# Patient Record
Sex: Female | Born: 1960 | Race: White | Hispanic: No | Marital: Married | Smoking: Never smoker
Health system: Southern US, Community
[De-identification: ages and names within clinical notes are randomized; demographics above are authoritative.]

## PROBLEM LIST (undated history)

## (undated) DIAGNOSIS — I10 Essential (primary) hypertension: Secondary | ICD-10-CM

## (undated) DIAGNOSIS — E559 Vitamin D deficiency, unspecified: Secondary | ICD-10-CM

## (undated) DIAGNOSIS — R7301 Impaired fasting glucose: Secondary | ICD-10-CM

## (undated) DIAGNOSIS — E785 Hyperlipidemia, unspecified: Secondary | ICD-10-CM

## (undated) HISTORY — DX: Hyperlipidemia, unspecified: E78.5

## (undated) HISTORY — PX: HYSTEROSCOPY: SHX211

## (undated) HISTORY — PX: WISDOM TOOTH EXTRACTION: SHX21

## (undated) HISTORY — DX: Essential (primary) hypertension: I10

## (undated) HISTORY — DX: Impaired fasting glucose: R73.01

## (undated) HISTORY — DX: Vitamin D deficiency, unspecified: E55.9

---

## 1998-05-03 ENCOUNTER — Other Ambulatory Visit: Admission: RE | Admit: 1998-05-03 | Discharge: 1998-05-03 | Payer: Self-pay | Admitting: Gynecology

## 1999-09-27 ENCOUNTER — Other Ambulatory Visit: Admission: RE | Admit: 1999-09-27 | Discharge: 1999-09-27 | Payer: Self-pay | Admitting: Obstetrics and Gynecology

## 2001-01-28 ENCOUNTER — Other Ambulatory Visit: Admission: RE | Admit: 2001-01-28 | Discharge: 2001-01-28 | Payer: Self-pay | Admitting: Obstetrics and Gynecology

## 2001-04-27 ENCOUNTER — Ambulatory Visit (HOSPITAL_COMMUNITY): Admission: RE | Admit: 2001-04-27 | Discharge: 2001-04-27 | Payer: Self-pay | Admitting: Obstetrics and Gynecology

## 2001-08-24 ENCOUNTER — Encounter: Admission: RE | Admit: 2001-08-24 | Discharge: 2001-11-22 | Payer: Self-pay | Admitting: Family Medicine

## 2004-02-20 ENCOUNTER — Encounter: Admission: RE | Admit: 2004-02-20 | Discharge: 2004-05-20 | Payer: Self-pay | Admitting: Family Medicine

## 2004-03-22 ENCOUNTER — Ambulatory Visit (HOSPITAL_BASED_OUTPATIENT_CLINIC_OR_DEPARTMENT_OTHER): Admission: RE | Admit: 2004-03-22 | Discharge: 2004-03-22 | Payer: Self-pay | Admitting: General Surgery

## 2004-03-22 ENCOUNTER — Ambulatory Visit (HOSPITAL_COMMUNITY): Admission: RE | Admit: 2004-03-22 | Discharge: 2004-03-22 | Payer: Self-pay | Admitting: General Surgery

## 2005-02-10 DIAGNOSIS — R7301 Impaired fasting glucose: Secondary | ICD-10-CM

## 2005-02-10 HISTORY — DX: Impaired fasting glucose: R73.01

## 2013-02-25 ENCOUNTER — Other Ambulatory Visit: Payer: Self-pay | Admitting: Family Medicine

## 2013-03-24 ENCOUNTER — Encounter: Payer: Self-pay | Admitting: Nurse Practitioner

## 2013-04-18 ENCOUNTER — Ambulatory Visit (INDEPENDENT_AMBULATORY_CARE_PROVIDER_SITE_OTHER): Payer: Managed Care, Other (non HMO) | Admitting: Nurse Practitioner

## 2013-04-18 ENCOUNTER — Encounter: Payer: Self-pay | Admitting: Nurse Practitioner

## 2013-04-18 VITALS — BP 136/82 | HR 72 | Ht 61.75 in | Wt 122.0 lb

## 2013-04-18 DIAGNOSIS — N952 Postmenopausal atrophic vaginitis: Secondary | ICD-10-CM

## 2013-04-18 DIAGNOSIS — R7302 Impaired glucose tolerance (oral): Secondary | ICD-10-CM

## 2013-04-18 DIAGNOSIS — IMO0002 Reserved for concepts with insufficient information to code with codable children: Secondary | ICD-10-CM | POA: Insufficient documentation

## 2013-04-18 DIAGNOSIS — Z124 Encounter for screening for malignant neoplasm of cervix: Secondary | ICD-10-CM

## 2013-04-18 DIAGNOSIS — Z0000 Encounter for general adult medical examination without abnormal findings: Secondary | ICD-10-CM

## 2013-04-18 DIAGNOSIS — R7309 Other abnormal glucose: Secondary | ICD-10-CM

## 2013-04-18 DIAGNOSIS — R82998 Other abnormal findings in urine: Secondary | ICD-10-CM

## 2013-04-18 DIAGNOSIS — R8290 Unspecified abnormal findings in urine: Secondary | ICD-10-CM

## 2013-04-18 DIAGNOSIS — Z01419 Encounter for gynecological examination (general) (routine) without abnormal findings: Secondary | ICD-10-CM

## 2013-04-18 MED ORDER — ESTRADIOL 0.1 MG/GM VA CREA: TOPICAL_CREAM | VAGINAL | Status: DC

## 2013-04-18 NOTE — Patient Instructions (Addendum)

## 2013-04-18 NOTE — Progress Notes (Signed)
Patient ID: Cynthia Cardenas, female   DOB: 1960-03-27, [age] y.o.   MRN: [id number] 53 y.o. 282P2002 Married Caucasian Fe here for NGYN annual exam changing from [facility].  She is postmenopausal since 2007 without HRT.  She did have some vaso symptoms that were tolerable.  Now increase in vaginal dryness and dyspareunia.  She has tried lubrications but just not feel that they help.  She and husband are frustrated. She is having no urinary symptoms.  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  They do have some stress with a daughter age 55 with ADD and has been mistreated by her boyfriend. She has neglected herself in trying to help establish the family business and work with her children.  Patient's last menstrual period was 02/10/2005.          Sexually active: yes  The current method of family planning is post menopausal status.    Exercising: no  The patient does not participate in regular exercise at present. Smoker:  no  Health Maintenance: Pap:  12/08/07, WNL, no history of abnormal MMG:  03/2013 Colonoscopy:  Scheduled for 05/2013 BMD:   12/20/07 @ [facility] TDaP:  2006 Labs: [facility]  Urine:  2+ leuk's, trace protein, pH 5.0   reports that she has never smoked. She has never used smokeless tobacco. She reports that she drinks alcohol. She reports that she does not use illicit drugs.  Past Medical History  Diagnosis Date  . Hypertension   . Hyperlipidemia   . Vitamin D deficiency   . Impaired fasting glucose 2007    Past Surgical History  Procedure Laterality Date  . Hysteroscopy      D&C  . Wisdom tooth extraction  early 20's    Current Outpatient Prescriptions  Medication Sig Dispense Refill  . atorvastatin (LIPITOR) 80 MG tablet Take 80 mg by mouth daily.      . Cholecalciferol (VITAMIN D) 2000 UNITS CAPS Take 1 capsule by mouth daily.      Marland Kitchen. losartan (COZAAR) 25 MG tablet Take 25 mg by mouth daily.      . metFORMIN (GLUCOPHAGE-XR) 500 MG 24 hr  tablet Take 500 mg by mouth daily with breakfast.      . estradiol (ESTRACE) 0.1 MG/GM vaginal cream Use 1 gm initially Three times a week  42.5 g  3   No current facility-administered medications for this visit.    Family History  Problem Relation Age of Onset  . Hypertension Mother   . Hyperlipidemia Mother   . Hypertension Father   . Heart disease Father     stent  . Hyperlipidemia Father   . Lung cancer Maternal Grandmother     smoker  . Heart attack Maternal Grandfather   . Hypertension Paternal Grandmother   . Heart disease Paternal Grandmother   . Heart attack Paternal Grandfather   . ADD / ADHD Daughter     ROS:  Pertinent items are noted in HPI.  Otherwise, a comprehensive ROS was negative.  Exam:   BP 136/82  Pulse 72  Ht 5' 1.75" (1.568 m)  Wt 122 lb (55.339 kg)  BMI 22.51 kg/m2  LMP 02/10/2005 Height: 5' 1.75" (156.8 cm)  Ht Readings from Last 3 Encounters:  04/18/13 5' 1.75" (1.568 m)    General appearance: alert, cooperative and appears stated age Head: Normocephalic, without obvious abnormality, atraumatic Neck: no adenopathy, supple, symmetrical, trachea midline and thyroid normal to inspection and palpation Lungs: clear to  auscultation bilaterally Breasts: normal appearance, no masses or tenderness Heart: regular rate and rhythm Abdomen: soft, non-tender; no masses,  no organomegaly Extremities: extremities normal, atraumatic, no cyanosis or edema Skin: Skin color, texture, turgor normal. No rashes or lesions Lymph nodes: Cervical, supraclavicular, and axillary nodes normal. No abnormal inguinal nodes palpated Neurologic: Grossly normal   Pelvic: External genitalia:  no lesions              Urethra:  normal appearing urethra with no masses, tenderness or lesions              Bartholin's and Skene's: normal                 Vagina: very atrophic vagina with pale color and thin yellow discharge, no lesions              Cervix: retroverted and  painful exam due to atrophy and position of uterus              Pap taken: yes Bimanual Exam:  Uterus:  normal size, contour, position, consistency, mobility, non-tender              Adnexa: no mass, fullness, tenderness               Rectovaginal: Confirms               Anus:  normal sphincter tone, no lesions  A:  Well Woman with normal exam  Postmenopausal very short course of HRT  Atrophic Vaginitis with dyspareunia  Impaired glucose tolerance  R/O UTI  History of HTN, hyperlipidemia  P:   Pap smear as per guidelines Done today  Estrace vaginal Cream 1 Gm three times weekly until recheck in 3 months  Counseled with risk of CVA, DVT, cancer, etc  Will follow with urine culture - no treatment started since asymptomatic  Mammogram due 03/2014  ROI for BMD and Mammo  Counseled on breast self exam, mammography screening, adequate intake of calcium and vitamin D, diet and exercise, Kegel's exercises return annually or prn  An After Visit Summary was printed and given to the patient.    She was a former patient of mine at [facility]

## 2013-04-19 ENCOUNTER — Encounter: Payer: Self-pay | Admitting: Nurse Practitioner

## 2013-04-19 LAB — URINE CULTURE
COLONY COUNT: NO GROWTH
ORGANISM ID, BACTERIA: NO GROWTH

## 2013-04-19 NOTE — Progress Notes (Signed)
Encounter reviewed by Dr. Temara Lanum Silva.  

## 2013-04-20 LAB — IPS PAP TEST WITH HPV

## 2013-04-25 ENCOUNTER — Telehealth: Payer: Self-pay | Admitting: Nurse Practitioner

## 2013-04-25 ENCOUNTER — Other Ambulatory Visit: Payer: Self-pay | Admitting: Nurse Practitioner

## 2013-04-25 LAB — POCT URINALYSIS DIPSTICK
Bilirubin, UA: NEGATIVE
Blood, UA: NEGATIVE
GLUCOSE UA: NEGATIVE
Ketones, UA: NEGATIVE
Nitrite, UA: NEGATIVE
UROBILINOGEN UA: NEGATIVE
pH, UA: 5

## 2013-04-25 NOTE — Telephone Encounter (Signed)
Results of BMD from [facility] results as follows: T Score: spine -1.0; left femoral neck -0.9; total : -0.2; right femoral neck -1.3; total -0.5.  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  This can now be done at her choice - there may be a slight difference since 2 different machines but does not have to get [facility] since she has transferred care. We did not get Mammo yet. Confirm where she had this and see if we can call.

## 2013-04-27 ENCOUNTER — Telehealth: Payer: Self-pay | Admitting: Nurse Practitioner

## 2013-04-27 ENCOUNTER — Telehealth: Payer: Self-pay | Admitting: *Deleted

## 2013-04-27 NOTE — Telephone Encounter (Signed)
Patient notified see result note 

## 2013-04-27 NOTE — Telephone Encounter (Signed)
S/w Terri at [facility] requested mammogram, the person that usually faxes them is gone for the day she will fax it tomorrow morning (Thursday) asked her to put attention BroughtonStephanie.

## 2013-04-27 NOTE — Telephone Encounter (Signed)
Patient notified, patient would like to have [facility] need order in please. [facility] will call and get this result.

## 2013-04-27 NOTE — Telephone Encounter (Signed)
I have attempted to contact this patient by phone with the following results: left message to return my call on answering machine (mobile).  Pt also has closed phone note regarding BMD.

## 2013-04-27 NOTE — Telephone Encounter (Signed)
Message copied by Luisa DagoPHILLIPS, STEPHANIE C on Wed Apr 27, 2013  9:41 AM ------      Message from: GRUBB, PATRICIA R      Created: Wed Apr 20, 2013  1:10 AM       Let patient know that urine culture is negative ------

## 2013-04-27 NOTE — Telephone Encounter (Signed)
I have attempted to contact this patient by phone with the following results: left message to return my call on answering machine (mobile).  Pt also has result note.

## 2013-04-27 NOTE — Telephone Encounter (Signed)
Pt is returning a call to Stephanie. 

## 2013-04-28 NOTE — Telephone Encounter (Signed)
Jasmine spoke to patient in 04/25/13 phone note.  Closing encounter.

## 2013-04-29 NOTE — Telephone Encounter (Signed)
MMG report received from [facility].  Hardcopy in your folder.  Please [facility] BMD at [facility].

## 2013-05-01 ENCOUNTER — Other Ambulatory Visit: Payer: Self-pay | Admitting: Nurse Practitioner

## 2013-05-01 DIAGNOSIS — E2839 Other primary ovarian failure: Secondary | ICD-10-CM

## 2013-06-20 ENCOUNTER — Telehealth: Payer: Self-pay | Admitting: Nurse Practitioner

## 2013-06-20 NOTE — Telephone Encounter (Signed)
BMD results from 12/20/2007 shows osteopenia of left femoral neck with T score at -1.6. BMD to be scanned.

## 2013-06-21 ENCOUNTER — Telehealth: Payer: Self-pay | Admitting: Orthopedic Surgery

## 2013-06-21 NOTE — Telephone Encounter (Signed)
Pt is returning your call states it is ok to leave a detailed message. There is a signed release stating it is ok to leave a detailed message on file.

## 2013-06-21 NOTE — Telephone Encounter (Signed)
LMTCB for message from PG. 

## 2013-06-21 NOTE — Telephone Encounter (Signed)
Message copied by Alfredo BattyABERNATHY, Elliette Seabolt P on Tue Jun 21, 2013  9:32 AM ------      Message from: Ria CommentGRUBB, PATRICIA R      Created: Mon Jun 20, 2013  7:37 PM       [facility] - this last one from 11/ 2009 shows osteopenia at left femoral neck at -1.6.  Please call patient and have her to repeat BMD this year. ------

## 2013-06-27 NOTE — Telephone Encounter (Signed)
LM on pt's VM as requested to have BMD repeated this year, as last result from 2009 showed osteopenia at left femoral neck. Advised pt she may need to obtain [facility] to take with her if she goes somewhere else to have it done. Pt to call back with any questions.

## 2013-07-19 ENCOUNTER — Encounter: Payer: Self-pay | Admitting: Nurse Practitioner

## 2013-07-19 ENCOUNTER — Ambulatory Visit (INDEPENDENT_AMBULATORY_CARE_PROVIDER_SITE_OTHER): Payer: Managed Care, Other (non HMO) | Admitting: Nurse Practitioner

## 2013-07-19 VITALS — BP 120/64 | HR 68 | Ht 61.75 in | Wt 120.0 lb

## 2013-07-19 DIAGNOSIS — N952 Postmenopausal atrophic vaginitis: Secondary | ICD-10-CM

## 2013-07-19 MED ORDER — ESTRADIOL 2 MG VA RING: 2.0000 mg | VAGINAL_RING | VAGINAL | Status: DC

## 2013-07-19 NOTE — Progress Notes (Signed)
Patient ID: Cynthia Cardenas, female   DOB: 04-May-1960, 53 y.o.   MRN: [id number] S: 53 y.o. G69P2002 Married Caucasian Fe here for follow up on atrophic vaginitis. She has been on Estrace vagina cream for the past three months.  She has been quite pleased and has had good results using the cream.  Less dyspareunia, less urinary symptoms of pressure and incontinence.  Does not need lubrication all the time. She is postmenopausal since 2007 without HRT. She did have some vaso symptoms that were tolerable. Then had increase in vaginal dryness and dyspareunia. She has tried lubrications but just not feel that they help. She and husband were frustrated but are much better now.  She would like to try the Estring as she feels her compliance would be better.  O: Abdomen: soft, non tender, no flank pain  Pelvic: less atrophic signs but not completely better. Still more moisture and not as tender with speculum and bimanual exam.  Urethra is normal.  A: Atrophic vaginitis - improved  P: Will give Rx for Estring and for the next week continue with 1/2 gm of Estrace for extra moisture. Then she can just use the ring.  Recheck in 3 months - if she is not able to remove the ring will do that for her while she is here.   She is very pleased with the outcome of the visit and hormone cream.  She is aware of potential side effects.

## 2013-07-19 NOTE — Patient Instructions (Signed)
Atrophic Vaginitis Atrophic vaginitis is a problem of low levels of estrogen in women. This problem can happen at any age. It is most common in women who have gone through menopause ("the change").  HOW WILL I KNOW IF I HAVE THIS PROBLEM? You may have:  Trouble with peeing (urinating), such as:  Going to the bathroom often.  A hard time holding your pee until you reach a bathroom.  Leaking pee.  Having pain when you pee.  Itching or a burning feeling.  Vaginal bleeding and spotting.  Pain during sex.  Dryness of the vagina.  A yellow, bad-smelling fluid (discharge) coming from the vagina. HOW WILL MY DOCTOR CHECK FOR THIS PROBLEM?  During your exam, your doctor will likely find the problem.  If there is a vaginal fluid, it may be checked for infection. HOW WILL THIS PROBLEM BE TREATED? Keep the vulvar skin as clean as possible. Moisturizers and lubricants can help with some of the symptoms. Estrogen replacement can help. There are 2 ways to take estrogen:  Systemic estrogen gets estrogen to your whole body. It takes many weeks or months before the symptoms get better.  You take an estrogen pill.  You use a skin patch. This is a patch that you put on your skin.  If you still have your uterus, your doctor may ask you to take a hormone. Talk to your doctor about the right medicine for you.  Estrogen cream.  This puts estrogen only at the part of your body where you apply it. The cream is put into the vagina or put on the vulvar skin. For some women, estrogen cream works faster than pills or the patch. CAN ALL WOMEN WITH THIS PROBLEM USE ESTROGEN? No. Women with certain types of cancer, liver problems, or problems with blood clots should not take estrogen. Your doctor can help you decide the best treatment for your symptoms. Document Released: 07/16/2007 Document Revised: 04/21/2011 Document Reviewed: 07/16/2007 Exit[employer] Patient Information 2014 [employer].  

## 2013-07-20 ENCOUNTER — Encounter: Payer: Self-pay | Admitting: Nurse Practitioner

## 2013-07-25 NOTE — Progress Notes (Signed)
Encounter reviewed by Dr. Brook Silva.  

## 2013-09-08 ENCOUNTER — Other Ambulatory Visit: Payer: Self-pay | Admitting: Family Medicine

## 2013-09-08 DIAGNOSIS — R1013 Epigastric pain: Secondary | ICD-10-CM

## 2013-09-13 ENCOUNTER — Ambulatory Visit
Admission: RE | Admit: 2013-09-13 | Discharge: 2013-09-13 | Disposition: A | Discharge status: Home / Self Care | Payer: Managed Care, Other (non HMO) | Source: Ambulatory Visit | Attending: Family Medicine | Admitting: Family Medicine

## 2013-09-13 DIAGNOSIS — R1013 Epigastric pain: Secondary | ICD-10-CM

## 2013-09-13 MED ORDER — IOHEXOL 350 MG/ML SOLN
100.0000 mL | Freq: Once | INTRAVENOUS | Status: AC | PRN
  Administered 2013-09-13: 100 mL via INTRAVENOUS

## 2013-09-29 ENCOUNTER — Other Ambulatory Visit: Payer: Self-pay | Admitting: Gastroenterology

## 2013-09-29 DIAGNOSIS — R1013 Epigastric pain: Secondary | ICD-10-CM

## 2013-09-29 DIAGNOSIS — R1310 Dysphagia, unspecified: Secondary | ICD-10-CM

## 2013-10-06 ENCOUNTER — Other Ambulatory Visit: Payer: Managed Care, Other (non HMO)

## 2013-10-19 ENCOUNTER — Ambulatory Visit (INDEPENDENT_AMBULATORY_CARE_PROVIDER_SITE_OTHER): Payer: Managed Care, Other (non HMO) | Admitting: Nurse Practitioner

## 2013-10-19 ENCOUNTER — Encounter: Payer: Self-pay | Admitting: Nurse Practitioner

## 2013-10-19 VITALS — BP 110/62 | HR 80 | Ht 61.75 in | Wt 121.0 lb

## 2013-10-19 DIAGNOSIS — N952 Postmenopausal atrophic vaginitis: Secondary | ICD-10-CM

## 2013-10-19 NOTE — Patient Instructions (Signed)
None is needed

## 2013-10-19 NOTE — Progress Notes (Signed)
Subjective:     Patient ID: Cynthia Cardenas, female   DOB: 11-26-1960, 53 y.o.   MRN: [id number]  HPI   53 y.o. G73P2002 Married Caucasian Fe here for follow up on atrophic vaginitis. She has been on Estrace vagina cream initially for  three months. She has been quite pleased and has had good results using the cream. Less dyspareunia, less urinary symptoms of pressure and incontinence. Does not need lubrication all the time. She then decided on using the Estring to help with compliance.  She is very happy with this and has noted improvement.  She did try to remove and was unable to do it herself.  Her husband was able to get it out without a problem.  She now has the second one in. She is postmenopausal since 2007 without HRT. She did have some vaso symptoms that were tolerable. Then had increase in vaginal dryness and dyspareunia. These symptoms are all better. She does want to see a plastic surgeon about breast reduction.  She will do some research and go in for consult visits.  Review of Systems  Constitutional: Negative.   Genitourinary: Negative.   Musculoskeletal: Negative.   Skin: Negative.   Neurological: Negative.   Psychiatric/Behavioral: Negative.        Objective:   Physical Exam  Constitutional: She is oriented to person, place, and time. She appears well-developed and well-nourished.  Genitourinary:  Vaginal tissue is much healthier and not atrophic. She has an Estring in place.  Neurological: She is alert and oriented to person, place, and time.  Psychiatric: She has a normal mood and affect. Her behavior is normal. Judgment and thought content normal.       Assessment:     Atrophic vaginitis - much improved with Estring Enlarged breast    Plan:     Continue Estring and will follow at next AEX She will contact plastic surgeon and go in for consult visit for possible breast reduction

## 2013-10-21 ENCOUNTER — Ambulatory Visit
Admission: RE | Admit: 2013-10-21 | Discharge: 2013-10-21 | Disposition: A | Discharge status: Home / Self Care | Payer: Managed Care, Other (non HMO) | Source: Ambulatory Visit | Attending: Gastroenterology | Admitting: Gastroenterology

## 2013-10-21 DIAGNOSIS — R1310 Dysphagia, unspecified: Secondary | ICD-10-CM

## 2013-10-21 DIAGNOSIS — R1013 Epigastric pain: Secondary | ICD-10-CM

## 2013-10-23 NOTE — Progress Notes (Signed)
Encounter reviewed by Dr. Hitesh Fouche Silva.  

## 2013-11-25 ENCOUNTER — Other Ambulatory Visit: Payer: Self-pay

## 2013-12-12 ENCOUNTER — Encounter: Payer: Self-pay | Admitting: Nurse Practitioner

## 2014-04-24 ENCOUNTER — Ambulatory Visit: Payer: Managed Care, Other (non HMO) | Admitting: Nurse Practitioner

## 2014-05-16 ENCOUNTER — Ambulatory Visit (INDEPENDENT_AMBULATORY_CARE_PROVIDER_SITE_OTHER): Payer: Managed Care, Other (non HMO) | Admitting: Nurse Practitioner

## 2014-05-16 ENCOUNTER — Encounter: Payer: Self-pay | Admitting: Nurse Practitioner

## 2014-05-16 VITALS — BP 120/68 | HR 76 | Ht 61.0 in | Wt 124.0 lb

## 2014-05-16 DIAGNOSIS — R8290 Unspecified abnormal findings in urine: Secondary | ICD-10-CM | POA: Diagnosis not present

## 2014-05-16 DIAGNOSIS — Z0000 Encounter for general adult medical examination without abnormal findings: Secondary | ICD-10-CM | POA: Diagnosis not present

## 2014-05-16 DIAGNOSIS — Z01419 Encounter for gynecological examination (general) (routine) without abnormal findings: Secondary | ICD-10-CM

## 2014-05-16 DIAGNOSIS — M8580 Other specified disorders of bone density and structure, unspecified site: Secondary | ICD-10-CM | POA: Diagnosis not present

## 2014-05-16 LAB — POCT URINALYSIS DIPSTICK
BILIRUBIN UA: NEGATIVE
GLUCOSE UA: NEGATIVE
KETONES UA: NEGATIVE
Nitrite, UA: NEGATIVE
Protein, UA: NEGATIVE
RBC UA: NEGATIVE
Urobilinogen, UA: NEGATIVE
pH, UA: 5.5

## 2014-05-16 MED ORDER — ESTRADIOL 2 MG VA RING: 2.0000 mg | VAGINAL_RING | VAGINAL | Status: DC

## 2014-05-16 NOTE — Progress Notes (Signed)
Patient ID: Cynthia Cardenas, female   DOB: 15-Jun-1960, 54 y.o.   MRN: [id number] 54 y.o. G48P2002 Married  Caucasian Fe here for annual exam.  Youngest daughter is getting married 07/2015.  Last HGB AIC was better.  Patient's last menstrual period was 02/10/2005.          Sexually active: Yes.    The current method of family planning is post menopausal status.    Exercising: No.  The patient does not participate in regular exercise at present. Smoker:  no  Health Maintenance: Pap:  04/18/13, negative with neg HR HPV MMG:  03/15/13, Bi-Rads 1:  Negative Colonoscopy:  2015, normal, repeat in 10 years BMD:   01/02/08, T-Score 1.0/-1.3/-1.6 TDaP:  2006 Labs:  HB:  Sigmund Hazel, MD  Urine:  Trace leuk's   reports that she has never smoked. She has never used smokeless tobacco. She reports that she drinks alcohol. She reports that she does not use illicit drugs.  Past Medical History  Diagnosis Date  . Hypertension   . Hyperlipidemia   . Vitamin D deficiency   . Impaired fasting glucose 2007    Past Surgical History  Procedure Laterality Date  . Hysteroscopy      D&C  . Wisdom tooth extraction  early 20's    Current Outpatient Prescriptions  Medication Sig Dispense Refill  . atorvastatin (LIPITOR) 80 MG tablet Take 80 mg by mouth daily.    . Cholecalciferol (VITAMIN D) 2000 UNITS CAPS Take 1 capsule by mouth daily.    Marland Kitchen esomeprazole (NEXIUM) 40 MG capsule Take 1 capsule by mouth daily.    Marland Kitchen estradiol (ESTRING) 2 MG vaginal ring Place 2 mg vaginally every 3 (three) months. Insert a new ring into vagina every 3 months 1 each 3  . losartan-hydrochlorothiazide (HYZAAR) 100-25 MG per tablet Take 1 tablet by mouth daily.    . metFORMIN (GLUCOPHAGE-XR) 500 MG 24 hr tablet Take 500 mg by mouth daily with breakfast.     No current facility-administered medications for this visit.    Family History  Problem Relation Age of Onset  . Hypertension Mother   . Hyperlipidemia Mother   .  Hypertension Father   . Heart disease Father     stent  . Hyperlipidemia Father   . Lung cancer Maternal Grandmother     smoker  . Heart attack Maternal Grandfather   . Hypertension Paternal Grandmother   . Heart disease Paternal Grandmother   . Heart attack Paternal Grandfather   . ADD / ADHD Daughter     ROS:  Pertinent items are noted in HPI.  Otherwise, a comprehensive ROS was negative.  Exam:   BP 120/68 mmHg  Pulse 76  Ht  (1.549 m)  Wt 124 lb (56.246 kg)  BMI 23.44 kg/m2  LMP 02/10/2005 Height:  (154.9 cm) Ht Readings from Last 3 Encounters:  05/16/14  (1.549 m)  10/19/13 5' 1.75" (1.568 m)  07/19/13 5' 1.75" (1.568 m)    General appearance: alert, cooperative and appears stated age Head: Normocephalic, without obvious abnormality, atraumatic Neck: no adenopathy, supple, symmetrical, trachea midline and thyroid normal to inspection and palpation Lungs: clear to auscultation bilaterally Breasts: normal appearance, no masses or tenderness Heart: regular rate and rhythm Abdomen: soft, non-tender; no masses,  no organomegaly Extremities: extremities normal, atraumatic, no cyanosis or edema Skin: Skin color, texture, turgor normal. No rashes or lesions Lymph nodes: Cervical, supraclavicular, and axillary nodes normal. No abnormal inguinal nodes palpated Neurologic:  Grossly normal   Pelvic: External genitalia:  no lesions              Urethra:  normal appearing urethra with no masses, tenderness or lesions              Bartholin's and Skene's: normal                 Vagina: normal appearing vagina with normal color and discharge, no lesions              Cervix: anteverted              Pap taken: No. Bimanual Exam:  Uterus:  normal size, contour, position, consistency, mobility, non-tender              Adnexa: no mass, fullness, tenderness               Rectovaginal: Confirms               Anus:  normal sphincter tone, no lesions  Chaperone present:  no  A:  Well Woman with normal exam  Postmenopausal very short course of HRT Atrophic Vaginitis with dyspareunia Impaired glucose tolerance R/O UTI History of HTN, hyperlipidemia   P:   Reviewed health and wellness pertinent to exam  Pap smear not taken today  Mammogram is scheduled - will get BMD done as well @ [facility]- note faxed  Refilled Estring for a year  Counseled with risk of DVT, CVA, cancer, etc  Follow with urine C&S  Counseled on breast self exam, mammography screening, use and side effects of HRT, adequate intake of calcium and vitamin D, diet and exercise, Kegel's exercises return annually or prn  An After Visit Summary was printed and given to the patient.

## 2014-05-16 NOTE — Patient Instructions (Signed)

## 2014-05-17 LAB — URINE CULTURE
COLONY COUNT: NO GROWTH
ORGANISM ID, BACTERIA: NO GROWTH

## 2014-05-21 NOTE — Progress Notes (Signed)
Encounter reviewed by Dr. Akaya Proffit Silva.  

## 2014-05-30 ENCOUNTER — Telehealth: Payer: Self-pay | Admitting: Nurse Practitioner

## 2014-05-30 NOTE — Telephone Encounter (Signed)
Pleas let pt. Know that BMD done on 05/23/14 shows a T Score at the spine of -1.0; left hip neck at -1.5; right hip neck at -1.9.  This falls into the low normal and upper osteopenic range.  The FRAX score for major fracture in 10 years is 7.4% (goal is <20%); FRAX score for hip fracture in 10 years is 0.8% (goal is < 3%).  She must do walking, aerobic, weight bearing exercise, calcium and Vit D.

## 2014-05-31 NOTE — Telephone Encounter (Signed)
Pt notified of results.  She is agreeable to plan.  She will exercise, continue 2000 IU Vit D as directed and get 1200 mg calcium with diet and supplement daily.

## 2014-06-16 DIAGNOSIS — N62 Hypertrophy of breast: Secondary | ICD-10-CM | POA: Insufficient documentation

## 2014-07-12 HISTORY — PX: BREAST REDUCTION SURGERY: SHX8

## 2014-08-02 ENCOUNTER — Other Ambulatory Visit: Payer: Self-pay

## 2014-08-07 DIAGNOSIS — Z9889 Other specified postprocedural states: Secondary | ICD-10-CM | POA: Insufficient documentation

## 2015-05-21 ENCOUNTER — Ambulatory Visit: Payer: Managed Care, Other (non HMO) | Admitting: Nurse Practitioner

## 2015-05-25 ENCOUNTER — Other Ambulatory Visit: Payer: Self-pay | Admitting: Nurse Practitioner

## 2015-05-28 NOTE — Telephone Encounter (Signed)
Medication refill request: Estring 2 mg Vaginal Ring Last AEX:  05/16/14 Next AEX: 06/08/15 Last MMG (if hormonal medication request): 05/29/14 bi-rads 2: benign Refill authorized: # 1 ring

## 2015-06-08 ENCOUNTER — Ambulatory Visit: Payer: Managed Care, Other (non HMO) | Admitting: Nurse Practitioner

## 2015-10-02 ENCOUNTER — Encounter: Payer: Self-pay | Admitting: Nurse Practitioner

## 2015-10-02 ENCOUNTER — Ambulatory Visit (INDEPENDENT_AMBULATORY_CARE_PROVIDER_SITE_OTHER): Payer: Managed Care, Other (non HMO) | Admitting: Nurse Practitioner

## 2015-10-02 VITALS — BP 114/68 | HR 80 | Ht 61.0 in | Wt 123.0 lb

## 2015-10-02 DIAGNOSIS — Z8639 Personal history of other endocrine, nutritional and metabolic disease: Secondary | ICD-10-CM

## 2015-10-02 DIAGNOSIS — Z87898 Personal history of other specified conditions: Secondary | ICD-10-CM

## 2015-10-02 DIAGNOSIS — N952 Postmenopausal atrophic vaginitis: Secondary | ICD-10-CM

## 2015-10-02 DIAGNOSIS — Z01419 Encounter for gynecological examination (general) (routine) without abnormal findings: Secondary | ICD-10-CM

## 2015-10-02 DIAGNOSIS — M8580 Other specified disorders of bone density and structure, unspecified site: Secondary | ICD-10-CM | POA: Diagnosis not present

## 2015-10-02 DIAGNOSIS — I10 Essential (primary) hypertension: Secondary | ICD-10-CM | POA: Diagnosis not present

## 2015-10-02 MED ORDER — ESTRADIOL 2 MG VA RING: VAGINAL_RING | VAGINAL | 4 refills | Status: DC

## 2015-10-02 NOTE — Patient Instructions (Addendum)

## 2015-10-02 NOTE — Progress Notes (Signed)
Reviewed personally.  M. Suzanne Leodis Alcocer, MD.  

## 2015-10-02 NOTE — Progress Notes (Signed)
Patient ID: Cynthia Cardenas, female   DOB: 1961-01-09, 55 y.o.   MRN: [id number]  55 y.o. 252P2002 Married  Caucasian Fe here for annual exam. She is feeling well.  HGB AIC is good and sees PCP later today.  She has lost weight over the past several yrs which has helped with glucose.  Daughters wedding was in June in [place] and ended up changing venues - but very beautiful.  She had breast reduction with lift last June and now doing well .  Patient's last menstrual period was 02/10/2005.          Sexually active: Yes.    The current method of family planning is post menopausal status.    Exercising: Yes.    walking Smoker:  no  Health Maintenance: Pap 04/18/13, Negative with neg HR HPV MMG: 10/01/15, [facility], no report at time of visit Colonoscopy: 2015, normal, repeat in 10 years, Dr. Matthias HughsBuccini BMD: 05/23/14 T-Score -1.0 Spine / -1.9 Right Femur Neck / -1.5 left Femur Neck TDaP: 2006 Hep C and HIV:  Add to PCP labs Labs: PCP takes care of labs, drawn 10/01/15 at PCP   reports that she has never smoked. She has never used smokeless tobacco. She reports that she drinks alcohol. She reports that she does not use drugs.  Past Medical History:  Diagnosis Date  . Hyperlipidemia   . Hypertension   . Impaired fasting glucose 2007  . Vitamin D deficiency     Past Surgical History:  Procedure Laterality Date  . HYSTEROSCOPY     D&C  . WISDOM TOOTH EXTRACTION  early 20's    Current Outpatient Prescriptions  Medication Sig Dispense Refill  . atorvastatin (LIPITOR) 80 MG tablet Take 80 mg by mouth daily.    . Cholecalciferol (VITAMIN D) 2000 UNITS CAPS Take 1 capsule by mouth daily.    Marland Kitchen. esomeprazole (NEXIUM) 40 MG capsule Take 1 capsule by mouth daily.    Marland Kitchen. ESTRING 2 MG vaginal ring INSERT 1 RING VAGINALLY EVERY 3 MONTHS. INSERT A NEW RING INTO VAGINA EVERY 3 MONTHS 1 each 0  . losartan-hydrochlorothiazide (HYZAAR) 100-25 MG per tablet Take 1 tablet by mouth daily.    . metFORMIN  (GLUCOPHAGE-XR) 500 MG 24 hr tablet Take 500 mg by mouth daily with breakfast.     No current facility-administered medications for this visit.     Family History  Problem Relation Age of Onset  . Hypertension Mother   . Hyperlipidemia Mother   . Hypertension Father   . Heart disease Father     stent  . Hyperlipidemia Father   . Lung cancer Maternal Grandmother     smoker  . Heart attack Maternal Grandfather   . Hypertension Paternal Grandmother   . Heart disease Paternal Grandmother   . Heart attack Paternal Grandfather   . ADD / ADHD Daughter     ROS:  Pertinent items are noted in HPI.  Otherwise, a comprehensive ROS was negative.  Exam:   LMP 02/10/2005    Ht Readings from Last 3 Encounters:  05/16/14 5\' 1"  (1.549 m)  10/19/13 5' 1.75" (1.568 m)  07/19/13 5' 1.75" (1.568 m)    General appearance: alert, cooperative and appears stated age Head: Normocephalic, without obvious abnormality, atraumatic Neck: no adenopathy, supple, symmetrical, trachea midline and thyroid normal to inspection and palpation Lungs: clear to auscultation bilaterally Breasts: normal appearance, no masses or tenderness, positive findings: implants bilaterally Heart: regular rate and rhythm Abdomen: soft, non-tender; no masses,  no organomegaly Extremities: extremities normal, atraumatic, no cyanosis or edema Skin: Skin color, texture, turgor normal. No rashes or lesions Lymph nodes: Cervical, supraclavicular, and axillary nodes normal. No abnormal inguinal nodes palpated Neurologic: Grossly normal   Pelvic: External genitalia:  no lesions              Urethra:  normal appearing urethra with no masses, tenderness or lesions              Bartholin's and Skene's: normal                 Vagina: normal appearing vagina with normal color and discharge, no lesions.  Estring in place.              Cervix: anteverted                Pap taken: No. Bimanual Exam:  Uterus:  normal size, contour,  position, consistency, mobility, non-tender              Adnexa: no mass, fullness, tenderness               Rectovaginal: Confirms               Anus:  normal sphincter tone, no lesions  Chaperone present: yes  A:  Well Woman with normal exam   Postmenopausal very short course of HRT Atrophic Vaginitis with dyspareunia - using Estring Impaired glucose tolerance - doing well on Glucophage History of HTN, hyperlipidemia   P:   Reviewed health and wellness pertinent to exam  Pap smear as above  Mammogram is due 09/2016 - if this one is normal  Refill Estring for a year - coupon is given  Counseled with risk of CVA, DVT, cancer, etc.  She will get Hep C and HIV at PCP  Counseled on breast self exam, mammography screening, use and side effects of HRT, adequate intake of calcium and vitamin D, diet and exercise, Kegel's exercises return annually or prn  An After Visit Summary was printed and given to the patient.

## 2015-10-10 ENCOUNTER — Other Ambulatory Visit: Payer: Self-pay | Admitting: Radiology

## 2015-10-16 ENCOUNTER — Encounter: Payer: Self-pay | Admitting: Nurse Practitioner

## 2015-12-04 ENCOUNTER — Encounter: Payer: Self-pay | Admitting: Nurse Practitioner

## 2016-04-15 ENCOUNTER — Telehealth: Payer: Self-pay | Admitting: *Deleted

## 2016-04-15 NOTE — Telephone Encounter (Signed)
Patient in 04 recall for 6 month F/U left breast DX MMG. Please contact patient to schedule. Thanks

## 2016-04-15 NOTE — Telephone Encounter (Signed)
Left message for patient to call Summer back.  

## 2016-04-15 NOTE — Telephone Encounter (Signed)
Per Denny PeonErin at [facility], patient was scheduled, but cancelled appointment for 2/27 stating [place] was [place] of [place].

## 2016-04-21 NOTE — Telephone Encounter (Signed)
Return call from patient.  She is in [place] with her sister-in-law who is in critical care and on a ventilator.  She is unable to come home at this time.  She has a reminder to call and schedule mammogram when she returns to [place], although she does not know when that will be.

## 2016-04-22 NOTE — Telephone Encounter (Signed)
Dr. Hyacinth MeekerMiller, Please see message below- would you like me to extend the recall? Please advise Thanks

## 2016-04-23 NOTE — Telephone Encounter (Signed)
OK to extend recall for two months.  Thanks.

## 2016-04-23 NOTE — Telephone Encounter (Signed)
Recall extended -eh 

## 2016-05-17 IMAGING — RF DG ESOPHAGUS
12 of 23 series · 12 of 24 positions shown · non-contrast
Comparison: 09/13/2013

CLINICAL DATA: Epigastric abdominal pain.  Dysphagia.

EXAM:
ESOPHOGRAM / BARIUM SWALLOW / BARIUM TABLET STUDY
TECHNIQUE: Combined double contrast and single contrast examination performed
using effervescent crystals, thick barium liquid, and thin barium
liquid. The patient was observed with fluoroscopy swallowing a 13mm
barium sulphate tablet.
FLUOROSCOPY TIME:  1 min, 18 seconds

[Series 2: run · 1 of 10 slices shown (1 of 12)]
[im 1/10]
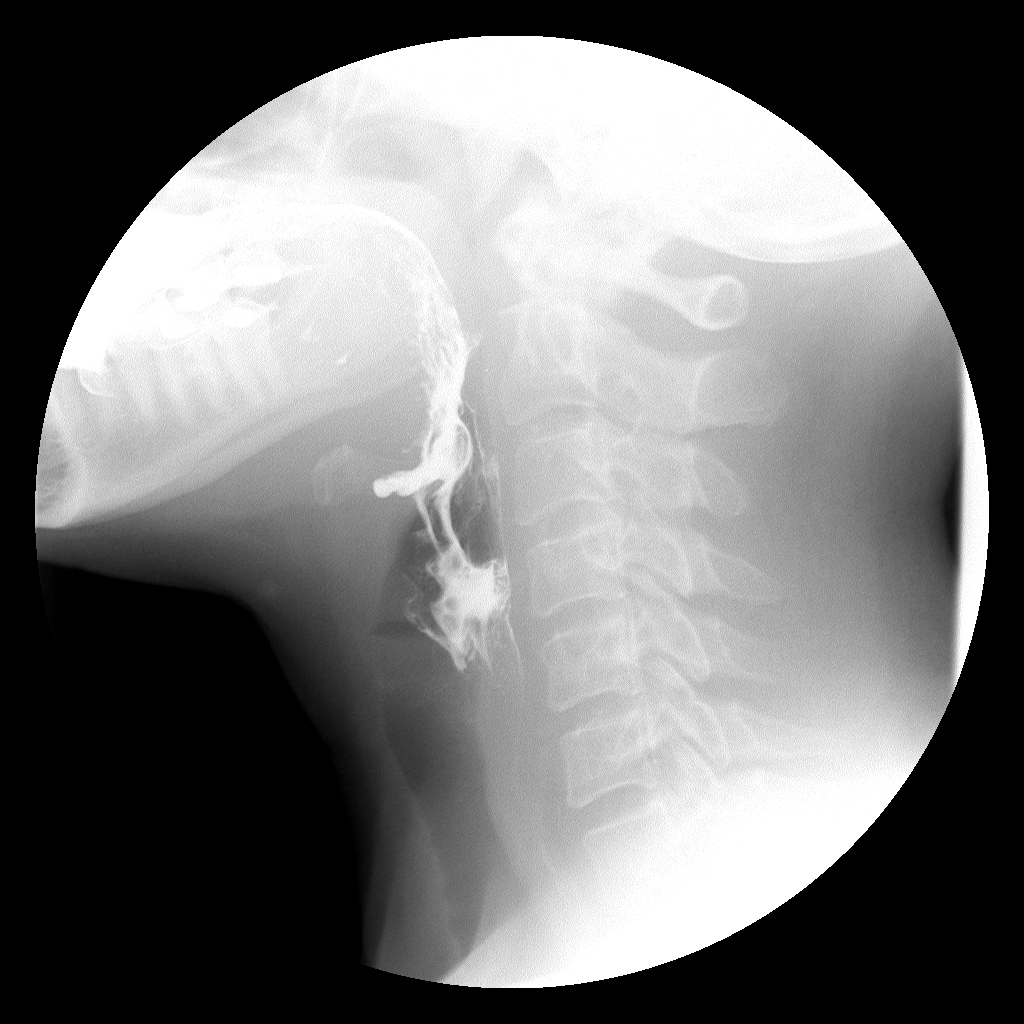

[Series 3: run · 1 of 1 slices shown (2 of 12)]
[im 1/1]
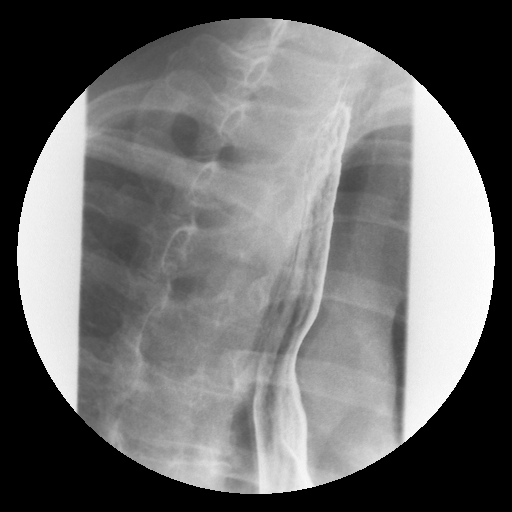

[Series 5: run · 1 of 1 slices shown (3 of 12)]
[im 1/1]
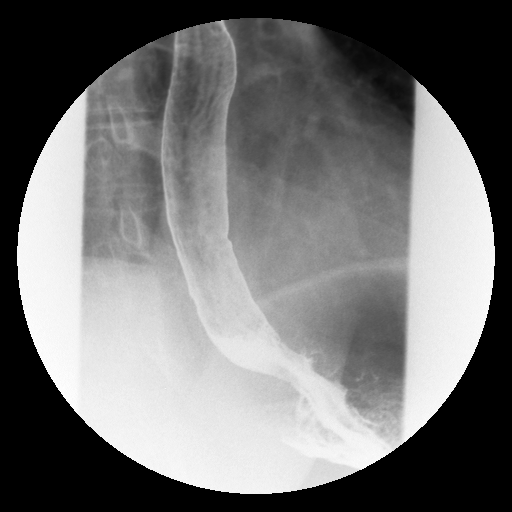

[Series 7: run · 1 of 1 slices shown (4 of 12)]
[im 1/1]
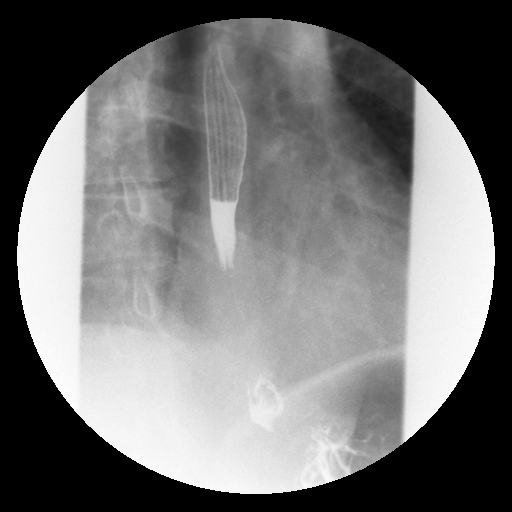

[Series 9: run · 1 of 1 slices shown (5 of 12)]
[im 1/1]
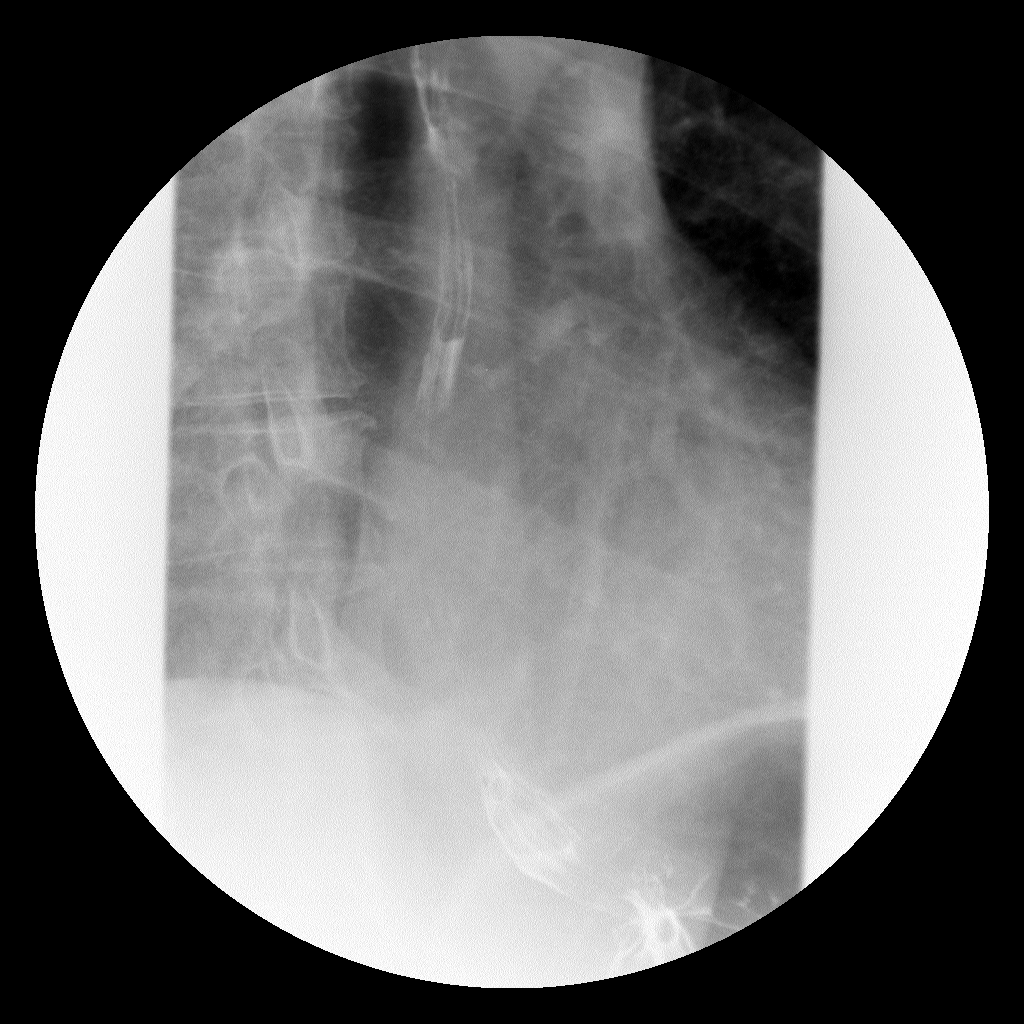

[Series 11: run · 1 of 1 slices shown (6 of 12)]
[im 1/1]
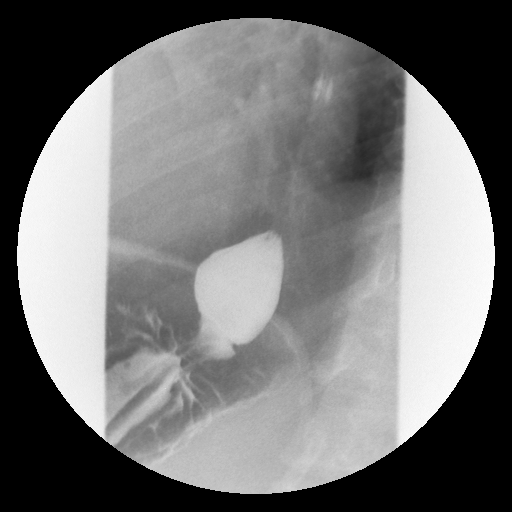

[Series 13: run · 1 of 1 slices shown (7 of 12)]
[im 1/1]
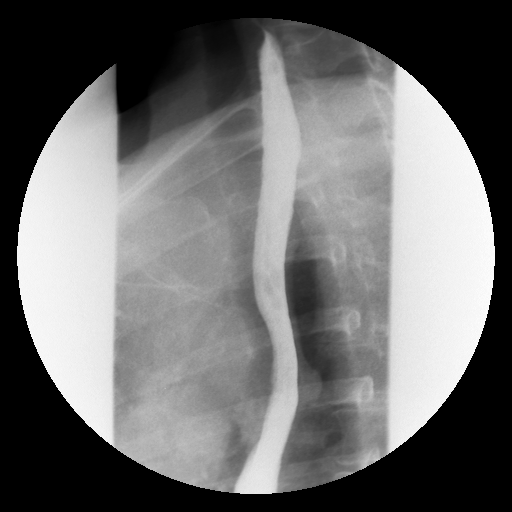

[Series 15: run · 1 of 1 slices shown (8 of 12)]
[im 1/1]
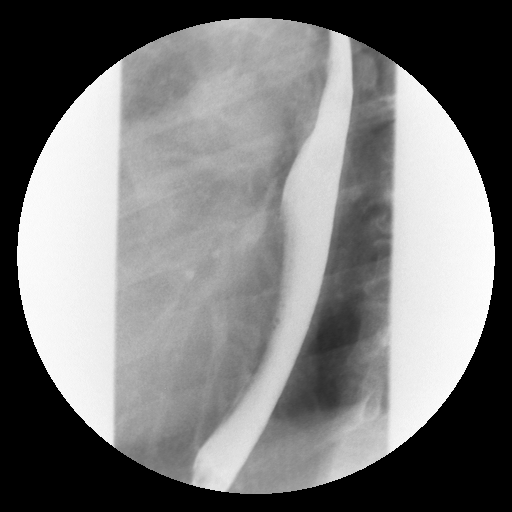

[Series 17: run · 1 of 1 slices shown (9 of 12)]
[im 1/1]
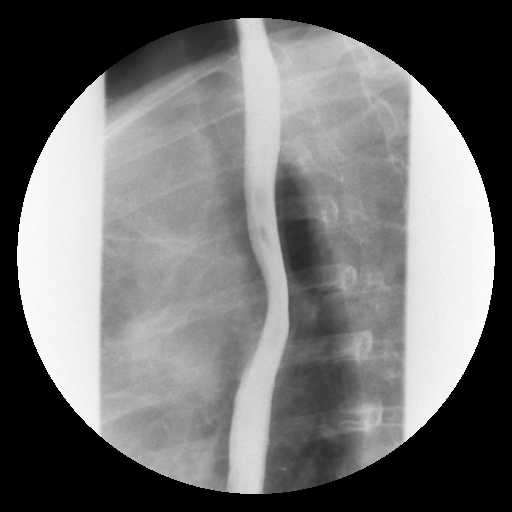

[Series 19: run · 1 of 1 slices shown (10 of 12)]
[im 1/1]
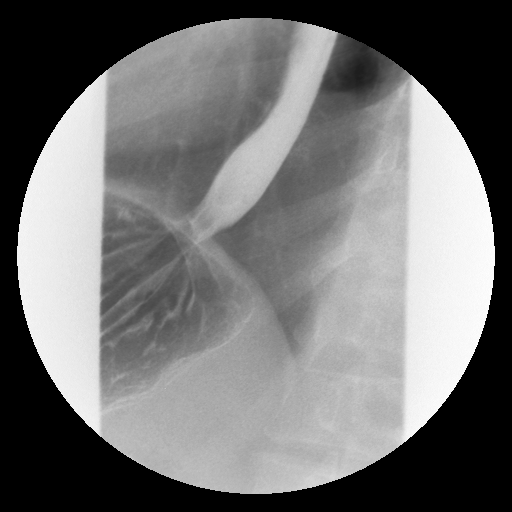

[Series 21: run · 1 of 1 slices shown (11 of 12)]
[im 1/1]
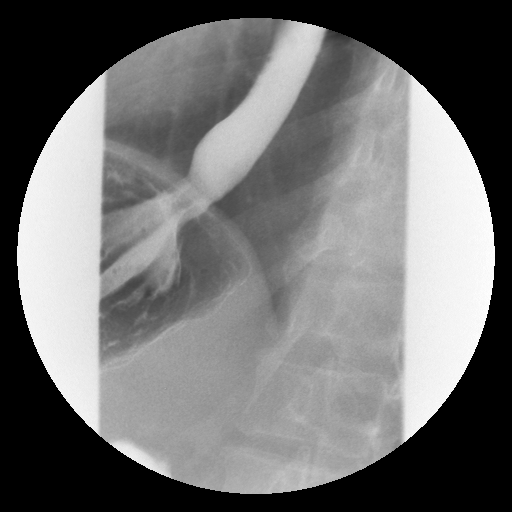

[Series 23: run · 1 of 1 slices shown (12 of 12)]
[im 1/1]
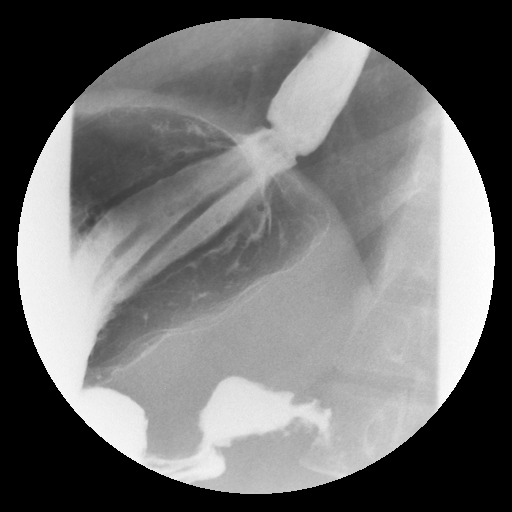

[12 of 24 positions shown; findings below may reference images not displayed]

FINDINGS: The pharyngeal phase of swallowing appears normal.

Normal primary peristaltic waves are present on [DATE] swallows.

There is a 1.3 cm in diameter distal esophageal mucosal ring
(Schatzki ring). This did not obstruct a 13 mm barium tablet from
rapidly entering the stomach.

No other significant abnormality observed.
IMPRESSION: 1. 1.3 cm in diameter. Distal esophageal mucosal ring. This is of a
diameter to commonly cause symptoms with solid foods. Otherwise
normal exam.

## 2016-06-05 ENCOUNTER — Encounter: Payer: Self-pay | Admitting: Nurse Practitioner

## 2016-09-26 ENCOUNTER — Telehealth: Payer: Self-pay | Admitting: Certified Nurse Midwife

## 2016-09-26 ENCOUNTER — Other Ambulatory Visit: Payer: Self-pay | Admitting: Obstetrics & Gynecology

## 2016-09-26 MED ORDER — ESTRADIOL 2 MG VA RING: VAGINAL_RING | VAGINAL | 0 refills | Status: DC

## 2016-09-26 NOTE — Telephone Encounter (Signed)
RF completed.  Encounter closed.

## 2016-09-26 NOTE — Telephone Encounter (Signed)
Medication refill request: Estring   Last AEX:  10-02-15  Next AEX: 10-30-16 Last MMG (if hormonal medication request): 05-27-16- F/U MMG in 6 month  Refill authorized: please advise

## 2016-09-26 NOTE — Telephone Encounter (Signed)
[facility] requesting a script for Estring refill.  Patient's insurance will no longer pay for home pharmacy.

## 2016-10-28 ENCOUNTER — Telehealth: Payer: Self-pay | Admitting: *Deleted

## 2016-10-28 NOTE — Telephone Encounter (Signed)
Call to patient. Patient notified of BMD results and verbalized understanding. See scanned report in EPIC.   Patient agreeable to disposition. Will close encounter.

## 2016-10-30 ENCOUNTER — Ambulatory Visit: Payer: Managed Care, Other (non HMO) | Admitting: Certified Nurse Midwife

## 2016-11-06 ENCOUNTER — Ambulatory Visit (INDEPENDENT_AMBULATORY_CARE_PROVIDER_SITE_OTHER): Payer: PRIVATE HEALTH INSURANCE | Admitting: Certified Nurse Midwife

## 2016-11-06 ENCOUNTER — Other Ambulatory Visit (HOSPITAL_COMMUNITY)
Admission: RE | Admit: 2016-11-06 | Discharge: 2016-11-06 | Disposition: A | Discharge status: Home / Self Care | Payer: PRIVATE HEALTH INSURANCE | Source: Ambulatory Visit | Attending: Obstetrics & Gynecology | Admitting: Obstetrics & Gynecology

## 2016-11-06 ENCOUNTER — Encounter: Payer: Self-pay | Admitting: Certified Nurse Midwife

## 2016-11-06 VITALS — BP 112/62 | HR 72 | Resp 16 | Ht 61.25 in | Wt 124.0 lb

## 2016-11-06 DIAGNOSIS — Z01419 Encounter for gynecological examination (general) (routine) without abnormal findings: Secondary | ICD-10-CM

## 2016-11-06 DIAGNOSIS — N951 Menopausal and female climacteric states: Secondary | ICD-10-CM

## 2016-11-06 DIAGNOSIS — Z124 Encounter for screening for malignant neoplasm of cervix: Secondary | ICD-10-CM | POA: Insufficient documentation

## 2016-11-06 DIAGNOSIS — N898 Other specified noninflammatory disorders of vagina: Secondary | ICD-10-CM | POA: Diagnosis not present

## 2016-11-06 DIAGNOSIS — N952 Postmenopausal atrophic vaginitis: Secondary | ICD-10-CM

## 2016-11-06 NOTE — Patient Instructions (Signed)

## 2016-11-06 NOTE — Progress Notes (Signed)
56 y.o. G85P2002 Married  Caucasian Fe here for annual exam. Menopausal no HRT. Denies vaginal bleeding. Now going to be grandmother! Daughter expecting now in [place].Has noted some increase in vaginal discharge with removal of Estring, no vaginal bleeding, ? Odor. Working well for vaginal dryness. Sees PCP for aex and labs and Cholesterol and hypertension/vitamin D  Management. All stable per patient. No other health issues today. Leaving for [place] tomorrow to be with daughter!   Patient's last menstrual period was 02/10/2005.          Sexually active: Yes.    The current method of family planning is post menopausal status.    Exercising: Yes.    walking Smoker:  no  Health Maintenance: Pap:  04-18-13 neg HPV HR neg History of Abnormal Pap: no MMG: 8/17 biopsy, 05-27-16 left breast birads 3:neg, 9/18 neg per patient Self Breast exams: no Colonoscopy:  2015 f/u 98yrs BMD:   2018 TDaP:  2018 Shingles: not done Pneumonia: not done Hep C and HIV: had done with pcp Labs: if needed.   reports that she has never smoked. She has never used smokeless tobacco. She reports that she drinks about 1.8 - 2.4 oz of alcohol per week . She reports that she does not use drugs.  Past Medical History:  Diagnosis Date  . Hyperlipidemia   . Hypertension   . Impaired fasting glucose 2007  . Vitamin D deficiency     Past Surgical History:  Procedure Laterality Date  . BREAST REDUCTION SURGERY Bilateral 07/2014  . HYSTEROSCOPY     D&C  . WISDOM TOOTH EXTRACTION  early 20's    Current Outpatient Prescriptions  Medication Sig Dispense Refill  . atorvastatin (LIPITOR) 80 MG tablet Take 80 mg by mouth daily.    . Cholecalciferol (VITAMIN D) 2000 UNITS CAPS Take 1 capsule by mouth daily.    Marland Kitchen esomeprazole (NEXIUM) 20 MG capsule Take 1 capsule by mouth daily.    Marland Kitchen estradiol (ESTRING) 2 MG vaginal ring INSERT 1 RING VAGINALLY EVERY 3 MONTHS. INSERT A NEW RING INTO VAGINA EVERY 3 MONTHS 1 each 0  .  losartan-hydrochlorothiazide (HYZAAR) 100-25 MG per tablet Take 1 tablet by mouth daily.     No current facility-administered medications for this visit.     Family History  Problem Relation Age of Onset  . Hypertension Mother   . Hyperlipidemia Mother   . Hypertension Father   . Heart disease Father        stent  . Hyperlipidemia Father   . Lung cancer Maternal Grandmother        smoker  . Heart attack Maternal Grandfather   . Hypertension Paternal Grandmother   . Heart disease Paternal Grandmother   . Heart attack Paternal Grandfather   . ADD / ADHD Daughter     ROS:  Pertinent items are noted in HPI.  Otherwise, a comprehensive ROS was negative.  Exam:   BP 112/62   Pulse 72   Resp 16   Ht 5' 1.25" (1.556 m)   Wt 124 lb (56.2 kg)   LMP 02/10/2005   BMI 23.24 kg/m  Height: 5' 1.25" (155.6 cm) Ht Readings from Last 3 Encounters:  11/06/16 5' 1.25" (1.556 m)  10/02/15  (1.549 m)  05/16/14  (1.549 m)    General appearance: alert, cooperative and appears stated age Head: Normocephalic, without obvious abnormality, atraumatic Neck: no adenopathy, supple, symmetrical, trachea midline and thyroid normal to inspection and palpation Lungs: clear  to auscultation bilaterally Breasts: normal appearance, no masses or tenderness, No nipple retraction or dimpling, No nipple discharge or bleeding, No axillary or supraclavicular adenopathy Heart: regular rate and rhythm Abdomen: soft, non-tender; no masses,  no organomegaly Extremities: extremities normal, atraumatic, no cyanosis or edema Skin: Skin color, texture, turgor normal. No rashes or lesions Lymph nodes: Cervical, supraclavicular, and axillary nodes normal. No abnormal inguinal nodes palpated Neurologic: Grossly normal   Pelvic: External genitalia:  no lesions              Urethra:  normal appearing urethra with no masses, tenderness or lesions              Bartholin's and Skene's: normal                  Vagina: normal appearing vagina with normal color and and grey/beige odorous  discharge, no lesions, Affirm taken              Cervix: multiparous appearance, retroverted and excoriation noted on posterior aspec of cervix, Estring removed which was around cervix firmly, bleeding noted with removal of Estring, which stopped after observation,               Pap taken: Yes.   Bimanual Exam:  Uterus:  normal size, contour, position, consistency, mobility, non-tender              Adnexa: normal adnexa and no mass, fullness, tenderness               Rectovaginal: Confirms               Anus:  normal sphincter tone, no lesions  Chaperone present: yes  A:  Well Woman with normal exam  Menopausal no HRT  Estring use for atrophic vaginitis  Cervical excoriation noted from Estring position  R/O vaginal infection  Hypertension,Cholesterol/Vitamin D management with PCP  P:   Reviewed health and wellness pertinent to exam  Aware of need to advise if vaginal bleeding  Discussed leaving Estring out for one month to allow area to heal, discussed insertion in upper part of vagina. If bleeding occurs need to discontinue use. Not refilled for follow up report.  Continue follow up with PCP as indicated  Pap smear: yes   counseled on breast self exam, mammography screening, feminine hygiene, menopause, adequate intake of calcium and vitamin D, diet and exercise  return annually or prn  An After Visit Summary was printed and given to the patient.

## 2016-11-07 LAB — VAGINITIS/VAGINOSIS, DNA PROBE
Candida Species: NEGATIVE
Gardnerella vaginalis: NEGATIVE
TRICHOMONAS VAG: NEGATIVE

## 2016-11-10 ENCOUNTER — Encounter: Payer: Self-pay | Admitting: Obstetrics & Gynecology

## 2016-11-10 LAB — CYTOLOGY - PAP: Diagnosis: NEGATIVE

## 2016-12-26 ENCOUNTER — Other Ambulatory Visit: Payer: Self-pay | Admitting: Obstetrics & Gynecology

## 2016-12-26 NOTE — Telephone Encounter (Signed)
Medication refill request: estring Last AEX:  11/06/16 DL  Next AEX: 16/02/908/1/19  Last MMG (if hormonal medication request): 10/21/16 BIRADS 2 benign  Refill authorized: 09/26/16 #1, 0RF. Today, please advise.

## 2017-08-10 ENCOUNTER — Other Ambulatory Visit: Payer: Self-pay | Admitting: Certified Nurse Midwife

## 2017-08-10 MED ORDER — ESTRADIOL 2 MG VA RING: VAGINAL_RING | VAGINAL | 1 refills | Status: DC

## 2017-08-10 NOTE — Telephone Encounter (Signed)
Medication refill request: Estring Vaginal ring Last AEX:  11/06/16 Next AEX: 11/10/17 Last MMG (if hormonal medication request): 11/20/16 Neg Refill authorized: Last refilled 12/26/2016 1 with 1 refill. Please approve until AEX if Appropriate

## 2017-08-10 NOTE — Telephone Encounter (Signed)
Patient is requesting refills of her estradiol to  [facility] [phone]. Patient states she is completely out of her medication and has been for some time.

## 2017-10-20 ENCOUNTER — Ambulatory Visit: Payer: PRIVATE HEALTH INSURANCE | Admitting: Certified Nurse Midwife

## 2017-10-20 ENCOUNTER — Other Ambulatory Visit: Payer: Self-pay

## 2017-10-20 ENCOUNTER — Encounter: Payer: Self-pay | Admitting: Certified Nurse Midwife

## 2017-10-20 VITALS — BP 132/78 | HR 70 | Resp 16 | Ht 61.25 in | Wt 123.0 lb

## 2017-10-20 DIAGNOSIS — N950 Postmenopausal bleeding: Secondary | ICD-10-CM | POA: Diagnosis not present

## 2017-10-20 DIAGNOSIS — N898 Other specified noninflammatory disorders of vagina: Secondary | ICD-10-CM | POA: Diagnosis not present

## 2017-10-20 NOTE — Progress Notes (Addendum)
57 y.o. Married Caucasian female G2P2002 here with complaint of vaginal bleeding again after using Estring. Patient removed and did not re-insert. Had seen increase discharge each time prior to bleeding. Describes discharge this time as bloody after sexual activity and had just put Estring back in.  Denies any pain or cramping with occurrence. Onset of symptoms one week ago. Denies new personal products. Does have vaginal dryness and has used coconut oil at times. No other issues today with sexual activity.. Patient is living here off and on and will be back in the area in two weeks. No other health issues today.    Review of Systems  Constitutional: Negative.   HENT: Negative.   Eyes: Negative.   Respiratory: Negative.   Cardiovascular: Negative.   Genitourinary:       Abnormal discharge, unscheduled bleeding & spotting  Musculoskeletal: Negative.   Skin: Negative.   Neurological: Negative.   Endo/Heme/Allergies: Negative.   Psychiatric/Behavioral: Negative.     O:Healthy female WDWN Affect: normal, orientation x 3  Exam: Skin: warm and dry Abdomen:soft, non tender, no masses, suprapubic nontender Inguinal Lymph nodes: no enlargement or tenderness Pelvic exam: External genital: normal female, no atrophy or lesions BUS: negative Vagina: scant white slight odorous discharge noted. Slight atrophy noted also. Small area of healed excoriation noted in posterior fornix     Affirm taken Cervix: normal, non tender, no CMT Uterus: normal, non tender Adnexa:normal, non tender, no masses or fullness noted Rectal area: normal appearance   A:Normal pelvic exam Vaginal bleeding from ? Excoriation from Estring or vigorous stimulation R/O post menopausal bleeding R/O vaginal infection   P:Discussed findings of slight atrophy and healed excoriated area in vagina and etiology. Discussed due previous bleeding with Estring in place,with her last occurrence with evaluation with me, felt it was  cervical. Excoriation noted probable stimulation, but concerns with PMB also.Stressed avoid use of vibrator at this point, until we know no other issues. Patient agreeable. Discussed would recommend PUS to make sure no endometrial tissue concerns with bleeding. Questions addressed. Patient agreeable to PUS. She will be called with insurance information and scheduled. Do not replace Estring at this time. Continue with coconut oil use for dryness. Lab: Affirm will treat if infection present.  Rv prn

## 2017-10-21 ENCOUNTER — Telehealth: Payer: Self-pay | Admitting: Certified Nurse Midwife

## 2017-10-21 LAB — VAGINITIS/VAGINOSIS, DNA PROBE
CANDIDA SPECIES: NEGATIVE
GARDNERELLA VAGINALIS: POSITIVE — AB
TRICHOMONAS VAG: NEGATIVE

## 2017-10-21 NOTE — Telephone Encounter (Signed)
Patient returned call. Spoke with patient regarding benefit for recommended ultrasound. Patient understood and agreeable. Patient ready to schedule. Patient scheduled 11/05/17 with Dr Hyacinth Meeker. Patient aware of appointment date, arrival time and   cancellation policy.  No further questions. Will close encounter   cc: Billie Ruddy, RN

## 2017-10-21 NOTE — Telephone Encounter (Signed)
Call placed to patient to review benefits for a recommended ultrasound. Left voicemail requesting a return call. °

## 2017-10-22 ENCOUNTER — Other Ambulatory Visit: Payer: Self-pay

## 2017-10-22 MED ORDER — METRONIDAZOLE 0.75 % VA GEL: VAGINAL | 0 refills | Status: DC

## 2017-10-23 ENCOUNTER — Telehealth: Payer: Self-pay | Admitting: Certified Nurse Midwife

## 2017-10-23 NOTE — Telephone Encounter (Signed)
Spoke with patient. Patient asking if she can use coconut oil vaginally with metrogel? Advised patient can apply coconut oil externally while using metrogel, can use vaginally after metrogel is completed. Patient verbalizes understanding.  Routing to provider for final review. Patient is agreeable to disposition. Will close encounter.

## 2017-10-23 NOTE — Telephone Encounter (Signed)
Patient would like to know if she can still use coconut oil along with the antibiotic cream.

## 2017-11-05 ENCOUNTER — Ambulatory Visit (INDEPENDENT_AMBULATORY_CARE_PROVIDER_SITE_OTHER): Payer: PRIVATE HEALTH INSURANCE

## 2017-11-05 ENCOUNTER — Ambulatory Visit: Payer: PRIVATE HEALTH INSURANCE | Admitting: Obstetrics & Gynecology

## 2017-11-05 ENCOUNTER — Other Ambulatory Visit: Payer: Self-pay | Admitting: Obstetrics & Gynecology

## 2017-11-05 ENCOUNTER — Encounter: Payer: Self-pay | Admitting: Obstetrics & Gynecology

## 2017-11-05 ENCOUNTER — Other Ambulatory Visit: Payer: PRIVATE HEALTH INSURANCE

## 2017-11-05 VITALS — BP 122/62 | HR 96 | Resp 16 | Ht 61.25 in | Wt 124.4 lb

## 2017-11-05 DIAGNOSIS — N950 Postmenopausal bleeding: Secondary | ICD-10-CM

## 2017-11-05 DIAGNOSIS — N898 Other specified noninflammatory disorders of vagina: Secondary | ICD-10-CM | POA: Diagnosis not present

## 2017-11-05 MED ORDER — ESTRADIOL 10 MCG VA TABS: ORAL_TABLET | VAGINAL | 0 refills | Status: DC

## 2017-11-05 NOTE — Progress Notes (Signed)
GYNECOLOGY  VISIT  CC:   Ultrasound, vaginal discharge after estring use  HPI: 57 y.o. G90P2002 Married White or Caucasian female here for ultrasound to evaluate endometrium after pt experienced two episodes of issues with vaginal discharge and some spotting after using Estring.  Felt this worked really well for a year or so but has not had as much success with this recently.    When seen on 10/20/17, she also have a posterior vaginal excoriation as well.  Would like to evaluate this today as well to make sure biopsy is not needed.  Bleeding and discharge has resolved.    She does want to be on some type of estrogen product vaginally due to atrophic symptoms.  Has tried replens, vaginal estrogens and the estring.  Pt now living in [place], [place], most of the time.  Stays with daughter in [place] when back in the area.  GYNECOLOGIC HISTORY: Patient's last menstrual period was 02/10/2005. Contraception: PMP Menopausal hormone therapy: none  Patient Active Problem List   Diagnosis Date Noted  . Status post bilateral breast reduction 08/07/2014  . Symptomatic mammary hypertrophy 06/16/2014  . Impaired glucose tolerance 04/18/2013  . Dyspareunia 04/18/2013  . Postmenopausal atrophic vaginitis 04/18/2013    Past Medical History:  Diagnosis Date  . Hyperlipidemia   . Hypertension   . Impaired fasting glucose 2007  . Vitamin D deficiency     Past Surgical History:  Procedure Laterality Date  . BREAST REDUCTION SURGERY Bilateral 07/2014  . HYSTEROSCOPY     D&C  . WISDOM TOOTH EXTRACTION  early 20's    MEDS:   Current Outpatient Medications on File Prior to Visit  Medication Sig Dispense Refill  . ALPRAZolam (XANAX) 0.25 MG tablet TK 1/2 TO 1 T PO TID AS NEEDED FOR ANXIETY  0  . atorvastatin (LIPITOR) 80 MG tablet Take 80 mg by mouth daily.    . Cholecalciferol (VITAMIN D) 2000 UNITS CAPS Take 1 capsule by mouth daily.    Marland Kitchen esomeprazole (NEXIUM) 20 MG capsule Take 1 capsule  by mouth daily.    Marland Kitchen losartan-hydrochlorothiazide (HYZAAR) 100-25 MG per tablet Take 1 tablet by mouth daily.    . metroNIDAZOLE (METROGEL) 0.75 % vaginal gel Insert 1 applicatorful vaginally at night times 7 70 g 0   No current facility-administered medications on file prior to visit.     ALLERGIES: Patient has no known allergies.  Family History  Problem Relation Age of Onset  . Hypertension Mother   . Hyperlipidemia Mother   . Hypertension Father   . Heart disease Father        stent  . Hyperlipidemia Father   . Lung cancer Maternal Grandmother        smoker  . Heart attack Maternal Grandfather   . Hypertension Paternal Grandmother   . Heart disease Paternal Grandmother   . Heart attack Paternal Grandfather   . ADD / ADHD Daughter     SH:  Married, non smoker  Review of Systems  All other systems reviewed and are negative.   PHYSICAL EXAMINATION:    BP 122/62 (BP Location: Left Arm, Patient Position: Sitting, Cuff Size: Normal)   Pulse 96   Resp 16   Ht 5' 1.25" (1.556 m)   Wt 124 lb 6.4 oz (56.4 kg)   LMP 02/10/2005   BMI 23.31 kg/m     Lymph:  no inguinal LAD noted  Pelvic: External genitalia:  no lesions  Urethra:  normal appearing urethra with no masses, tenderness or lesions              Bartholins and Skenes: normal                 Vagina: normal appearing vagina with normal color and discharge, no lesions              Cervix: no lesions              Bimanual Exam:  Uterus:  normal size, contour, position, consistency, mobility, non-tender  Ultrasound results: Uterus:  6.3 x 3.7 x 2.4cm Endometrium:  1.62mm and symmetric Left ovary:  2.5 x 2.0 x 1.0cm Right ovary:  2.5 x 1.6 x 1.6cm Cul de sac:  No free fluid  Chaperone was present for exam.  Assessment: H/O excoriation from Estring, resolved PMP bleeding with thin endometrium Vaginal atrophic changes  Plan: Will initiate vagifem pv nightly x 14 nights, then twice weekly.  Rx  for #18/0RF to pharmacy on file.  She will call and give update in one week. AEX for next week cancelled as she will not be in [place] at that time.  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   Pap 2018 negative.  Normal ultrasound today.  Negative MMG 10/21/16.  Aware this is due.   ~15 minutes spent with patient >50% of time was in face to face discussion of above.

## 2017-11-10 ENCOUNTER — Ambulatory Visit: Payer: PRIVATE HEALTH INSURANCE | Admitting: Certified Nurse Midwife

## 2017-12-09 ENCOUNTER — Other Ambulatory Visit: Payer: Self-pay | Admitting: Obstetrics & Gynecology

## 2017-12-09 NOTE — Telephone Encounter (Signed)
Patient requesting refill of estradiol sent [place] [facility] [place] [phone]. Patient wondering if enough refills will be sent until her aex next year?

## 2017-12-09 NOTE — Telephone Encounter (Signed)
Medication refill request:Estrodiol  Last AEX:  11/06/16 was seen on 11/05/17 for Postmenopausal Bleeding.  Next AEX: Nothing scheduled at this time    Last MMG (if hormonal medication request):10/21/16 bi-rads 2 benign    Refill authorized:  Patient requesting refill for for estrodiol [facility] to [facility] [place] Patient wondering if enough refills will be sent until her next AEX next year. I did not change what is dispensed. The pharmacy has been updated.  Please advise.

## 2017-12-11 MED ORDER — ESTRADIOL 10 MCG VA TABS: ORAL_TABLET | VAGINAL | 3 refills | Status: AC

## 2018-12-24 ENCOUNTER — Other Ambulatory Visit: Payer: Self-pay | Admitting: Obstetrics & Gynecology

## 2019-01-31 ENCOUNTER — Other Ambulatory Visit: Payer: Self-pay | Admitting: Obstetrics & Gynecology

## 2019-03-07 ENCOUNTER — Other Ambulatory Visit: Payer: Self-pay | Admitting: Obstetrics & Gynecology

## 2019-03-08 ENCOUNTER — Telehealth: Payer: Self-pay | Admitting: Certified Nurse Midwife

## 2019-03-08 NOTE — Telephone Encounter (Signed)
Patient is asking for a refill of her "vaginal estrogen cream" to [facility] [address] [place], [place], [place]. She has moved away and has yet to make an appointment with a GYN. She does have an appointment with an internist 03/21/19. If she needs an appointment she is willing to do a virtual visit.

## 2019-03-08 NOTE — Telephone Encounter (Signed)
Patient is past due for aex. Last aex was in 2018. rx was denied by Dr Hyacinth Meeker 11/2017 & several times since. Pharmacy note placed for patient to call office to schedule yearly exam.

## 2019-03-08 NOTE — Telephone Encounter (Signed)
Last AEX 11/06/16 OV 11/05/17 MMG on file 10/21/16  Leota Sauers, CNM -Please confirm patient will need updated MMG and AEX for refill?

## 2019-03-08 NOTE — Telephone Encounter (Signed)
Spoke with patient. She has relocated to [place] for the time being. Advised updated MMG and AEX needed for further refills. Patient will plan to further discuss with PCP at upcoming appt. Patient thankful for return call.   Routing to provider for final review. Patient is agreeable to disposition. Will close encounter.

## 2019-03-08 NOTE — Telephone Encounter (Signed)
Agree, with plan. Can not assess  physically with Virtual visit

## 2019-05-03 ENCOUNTER — Encounter: Payer: Self-pay | Admitting: Certified Nurse Midwife

## 2023-08-28 ENCOUNTER — Encounter: Payer: Self-pay | Admitting: Advanced Practice Midwife
